# Patient Record
Sex: Female | Born: 1980 | Race: White | Hispanic: No | Smoking: Never smoker
Health system: Southern US, Community
[De-identification: ages and names within clinical notes are randomized; demographics above are authoritative.]

## PROBLEM LIST (undated history)

## (undated) DIAGNOSIS — J45909 Unspecified asthma, uncomplicated: Secondary | ICD-10-CM

---

## 2015-09-21 ENCOUNTER — Emergency Department (HOSPITAL_COMMUNITY)
Admission: EM | Admit: 2015-09-21 | Discharge: 2015-09-21 | Disposition: A | Discharge status: Home / Self Care | Payer: Medicaid Other | Attending: Emergency Medicine | Admitting: Emergency Medicine

## 2015-09-21 ENCOUNTER — Emergency Department (HOSPITAL_COMMUNITY): Payer: Medicaid Other

## 2015-09-21 ENCOUNTER — Encounter (HOSPITAL_COMMUNITY): Payer: Self-pay | Admitting: Emergency Medicine

## 2015-09-21 DIAGNOSIS — W19XXXA Unspecified fall, initial encounter: Secondary | ICD-10-CM

## 2015-09-21 DIAGNOSIS — S4992XA Unspecified injury of left shoulder and upper arm, initial encounter: Secondary | ICD-10-CM | POA: Insufficient documentation

## 2015-09-21 DIAGNOSIS — S8992XA Unspecified injury of left lower leg, initial encounter: Secondary | ICD-10-CM | POA: Diagnosis not present

## 2015-09-21 DIAGNOSIS — S6992XA Unspecified injury of left wrist, hand and finger(s), initial encounter: Secondary | ICD-10-CM | POA: Diagnosis not present

## 2015-09-21 DIAGNOSIS — Y9389 Activity, other specified: Secondary | ICD-10-CM | POA: Insufficient documentation

## 2015-09-21 DIAGNOSIS — Y9289 Other specified places as the place of occurrence of the external cause: Secondary | ICD-10-CM | POA: Diagnosis not present

## 2015-09-21 DIAGNOSIS — W108XXA Fall (on) (from) other stairs and steps, initial encounter: Secondary | ICD-10-CM | POA: Insufficient documentation

## 2015-09-21 DIAGNOSIS — J45909 Unspecified asthma, uncomplicated: Secondary | ICD-10-CM | POA: Diagnosis not present

## 2015-09-21 DIAGNOSIS — S4991XA Unspecified injury of right shoulder and upper arm, initial encounter: Secondary | ICD-10-CM | POA: Insufficient documentation

## 2015-09-21 DIAGNOSIS — S8991XA Unspecified injury of right lower leg, initial encounter: Secondary | ICD-10-CM | POA: Diagnosis present

## 2015-09-21 DIAGNOSIS — Y92009 Unspecified place in unspecified non-institutional (private) residence as the place of occurrence of the external cause: Secondary | ICD-10-CM

## 2015-09-21 DIAGNOSIS — Z79899 Other long term (current) drug therapy: Secondary | ICD-10-CM | POA: Insufficient documentation

## 2015-09-21 DIAGNOSIS — Y998 Other external cause status: Secondary | ICD-10-CM | POA: Diagnosis not present

## 2015-09-21 HISTORY — DX: Unspecified asthma, uncomplicated: J45.909

## 2015-09-21 MED ORDER — OXYCODONE HCL 5 MG PO TABS
5.0000 mg | ORAL_TABLET | Freq: Once | ORAL | Status: AC
  Administered 2015-09-21: 5 mg via ORAL
  Filled 2015-09-21: qty 1

## 2015-09-21 MED ORDER — ACETAMINOPHEN 500 MG PO TABS
1000.0000 mg | ORAL_TABLET | Freq: Once | ORAL | Status: AC
  Administered 2015-09-21: 1000 mg via ORAL
  Filled 2015-09-21: qty 2

## 2015-09-21 MED ORDER — IBUPROFEN 800 MG PO TABS
800.0000 mg | ORAL_TABLET | Freq: Once | ORAL | Status: AC
Start: 2015-09-21 — End: 2015-09-21
  Administered 2015-09-21: 800 mg via ORAL
  Filled 2015-09-21: qty 1

## 2015-09-21 NOTE — ED Provider Notes (Signed)
CSN: [id number]     Arrival date & time 09/21/15  0143 History  By signing my name below, I, [name], attest that this documentation has been prepared under the direction and in the presence of Melene Planan Tametria Aho, DO . Electronically Signed: Freida Busmaniana Omoyeni, Scribe. 09/21/2015. 2:29 AM.    Chief Complaint  Patient presents with  . Fall  . Knee Pain  . Shoulder Pain   The history is provided by the patient. No language interpreter was used.     HPI Comments:  Cynthia Howell is a 35 y.o. female who presents to the Emergency Department s/p fall 4 days ago complaining of constant right knee pain, right shoulder, and left middle finger pain following the incident. Pt states she fell down 6 steps and landed on her right side.  She denies neck pain, CP, and SOB. No alleviating factors noted.    Past Medical History  Diagnosis Date  . Asthma    History reviewed. No pertinent past surgical history. No family history on file. Social History  Substance Use Topics  . Smoking status: Never Smoker   . Smokeless tobacco: None  . Alcohol Use: Yes     Comment: 4 X yearly   OB History    No data available     Review of Systems  Constitutional: Negative for fever and chills.  HENT: Negative for congestion and rhinorrhea.   Eyes: Negative for redness and visual disturbance.  Respiratory: Negative for shortness of breath and wheezing.   Cardiovascular: Negative for chest pain and palpitations.  Gastrointestinal: Negative for nausea and vomiting.  Genitourinary: Negative for dysuria and urgency.  Musculoskeletal: Positive for myalgias and arthralgias.  Skin: Negative for pallor and wound.  Neurological: Negative for dizziness and headaches.  All other systems reviewed and are negative.   Allergies  Morphine and related  Home Medications   Prior to Admission medications   Medication Sig Start Date End Date Taking? Authorizing Provider  albuterol (PROVENTIL HFA;VENTOLIN HFA) 108 (90 Base)  MCG/ACT inhaler Inhale 1 puff into the lungs every 6 (six) hours as needed for wheezing or shortness of breath.   Yes Historical Provider, MD  albuterol (PROVENTIL) (2.5 MG/3ML) 0.083% nebulizer solution Take 2.5 mg by nebulization every 6 (six) hours as needed for wheezing or shortness of breath.   Yes Historical Provider, MD  sertraline (ZOLOFT) 50 MG tablet Take 50 mg by mouth daily.   Yes Historical Provider, MD   BP 110/79 mmHg  Pulse 84  Temp(Src) 98 F (36.7 C) (Oral)  Resp 14  SpO2 93%  LMP 09/21/2015 Physical Exam  Constitutional: She is oriented to person, place, and time. She appears well-developed and well-nourished. No distress.  HENT:  Head: Normocephalic and atraumatic.  Eyes: EOM are normal. Pupils are equal, round, and reactive to light.  Neck: Normal range of motion. Neck supple.  Cardiovascular: Normal rate and regular rhythm.  Exam reveals no gallop and no friction rub.   No murmur heard. Pulmonary/Chest: Effort normal. She has no wheezes. She has no rales.  Abdominal: Soft. She exhibits no distension. There is no tenderness.  Musculoskeletal: She exhibits tenderness. She exhibits no edema.  Tenderness to medial attachment of hamstring of left knee; no intraarticular swelling; meniscal ligaments appear to be intact muscular tenderness noted just medial to humeral head of left shoulder Mild tenderness to right lateral scalpula Pt complains of pain to the left 3rd PIP  Neurological: She is alert and oriented to person, place, and time.  Skin: Skin is warm and dry. She is not diaphoretic.  Psychiatric: She has a normal mood and affect. Her behavior is normal.  Nursing note and vitals reviewed.   ED Course  Procedures   DIAGNOSTIC STUDIES:  Oxygen Saturation is 99% on RA, normal by my interpretation.    COORDINATION OF CARE:  2:20 AM Discussed treatment plan with pt at bedside and pt agreed to plan.  Labs Review Labs Reviewed - No data to display  Imaging  Review Dg Shoulder Right  09/21/2015  CLINICAL DATA:  35 year old female with fall and right shoulder pain. EXAM: RIGHT SHOULDER - 2+ VIEW COMPARISON:  Left shoulder radiograph dated 09/21/2015 FINDINGS: There is no evidence of fracture or dislocation. There is no evidence of arthropathy or other focal bone abnormality. Soft tissues are unremarkable. Partially visualized thoracic spine scoliosis. IMPRESSION: No acute fracture or dislocation of the right shoulder. Electronically Signed   By: Elgie Collard M.D.   On: 09/21/2015 03:36   Dg Shoulder Left  09/21/2015  CLINICAL DATA:  Larey Seat on steps tonight EXAM: LEFT SHOULDER - 2+ VIEW COMPARISON:  None. FINDINGS: There is no evidence of fracture or dislocation. There is no evidence of arthropathy or other focal bone abnormality. Soft tissues are unremarkable. IMPRESSION: Negative. Electronically Signed   By: Ellery Plunk M.D.   On: 09/21/2015 03:35   Dg Knee Complete 4 Views Right  09/21/2015  CLINICAL DATA:  Initial valuation for acute trauma, fall. EXAM: RIGHT KNEE - COMPLETE 4+ VIEW COMPARISON:  None. FINDINGS: There is no evidence of fracture, dislocation, or joint effusion. There is no evidence of arthropathy or other focal bone abnormality. Soft tissues are unremarkable. IMPRESSION: No acute osseous abnormality about the knee. Electronically Signed   By: Rise Mu M.D.   On: 09/21/2015 03:36   Dg Finger Middle Left  09/21/2015  CLINICAL DATA:  Larey Seat on steps tonight EXAM: LEFT MIDDLE FINGER 2+V COMPARISON:  None. FINDINGS: There is PIP soft swelling. No fracture or dislocation. No radiopaque foreign body. IMPRESSION: Negative. Electronically Signed   By: Ellery Plunk M.D.   On: 09/21/2015 03:35   I have personally reviewed and evaluated these images and lab results as part of my medical decision-making.   EKG Interpretation None      MDM   Final diagnoses:  Fall at home, initial encounter    35 yo F With a chief  complaint of a mechanical fall. Patient fell downstairs. Complaining of mostly right-sided pain. Plain films are negative. On exam patient with no significant physical findings. Discharge home. NSAIDs for pain.  I personally performed the services described in this documentation, which was scribed in my presence. The recorded information has been reviewed and is accurate.    I have discussed the diagnosis/risks/treatment options with the patient and family and believe the pt to be eligible for discharge home to follow-up with PCP. We also discussed returning to the ED immediately if new or worsening sx occur. We discussed the sx which are most concerning (e.g., continued pain >1 week) that necessitate immediate return. Medications administered to the patient during their visit and any new prescriptions provided to the patient are listed below.  Medications given during this visit Medications  acetaminophen (TYLENOL) tablet 1,000 mg (1,000 mg Oral Given 09/21/15 0350)  ibuprofen (ADVIL,MOTRIN) tablet 800 mg (800 mg Oral Given 09/21/15 0350)  oxyCODONE (Oxy IR/ROXICODONE) immediate release tablet 5 mg (5 mg Oral Given 09/21/15 0350)    Discharge Medication List as  of 09/21/2015  4:04 AM      The patient appears reasonably screen and/or stabilized for discharge and I doubt any other medical condition or other [facility] requiring further screening, evaluation, or treatment in the ED at this time prior to discharge.     Melene Plan, DO 09/21/15 [phone]

## 2015-09-21 NOTE — Discharge Instructions (Signed)
Take 4 over the counter ibuprofen tablets 3 times a day or 2 over-the-counter naproxen tablets twice a day for pain. ° °

## 2015-09-21 NOTE — ED Notes (Signed)
Pt reports that she fell down the stairs last Friday.  She denies hitting her head or LOC.  Reports that she has taken ibprofen "thinking the pain would go away" but the pain has not decreased.  Pain in her right knee, right shoulder and middle finger on the left hand.

## 2016-10-23 IMAGING — CR DG KNEE COMPLETE 4+V*R*
4 series · 4 of 4 positions shown · non-contrast
Comparison: None.

CLINICAL DATA: Initial valuation for acute trauma, fall.

EXAM:
RIGHT KNEE - COMPLETE 4+ VIEW

[knee ap]
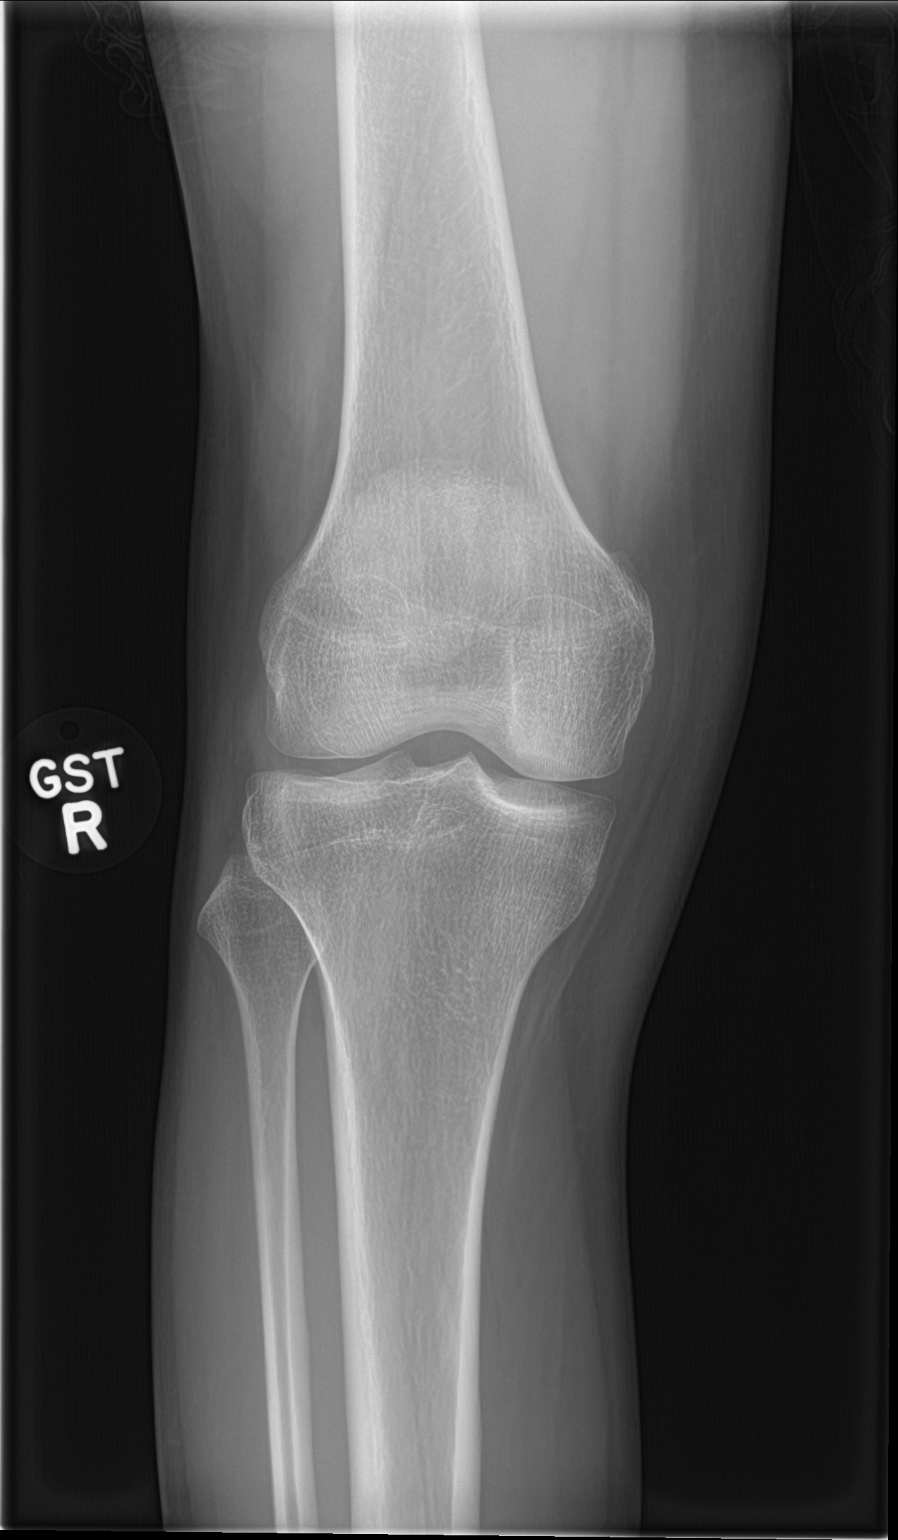

[knee lat]
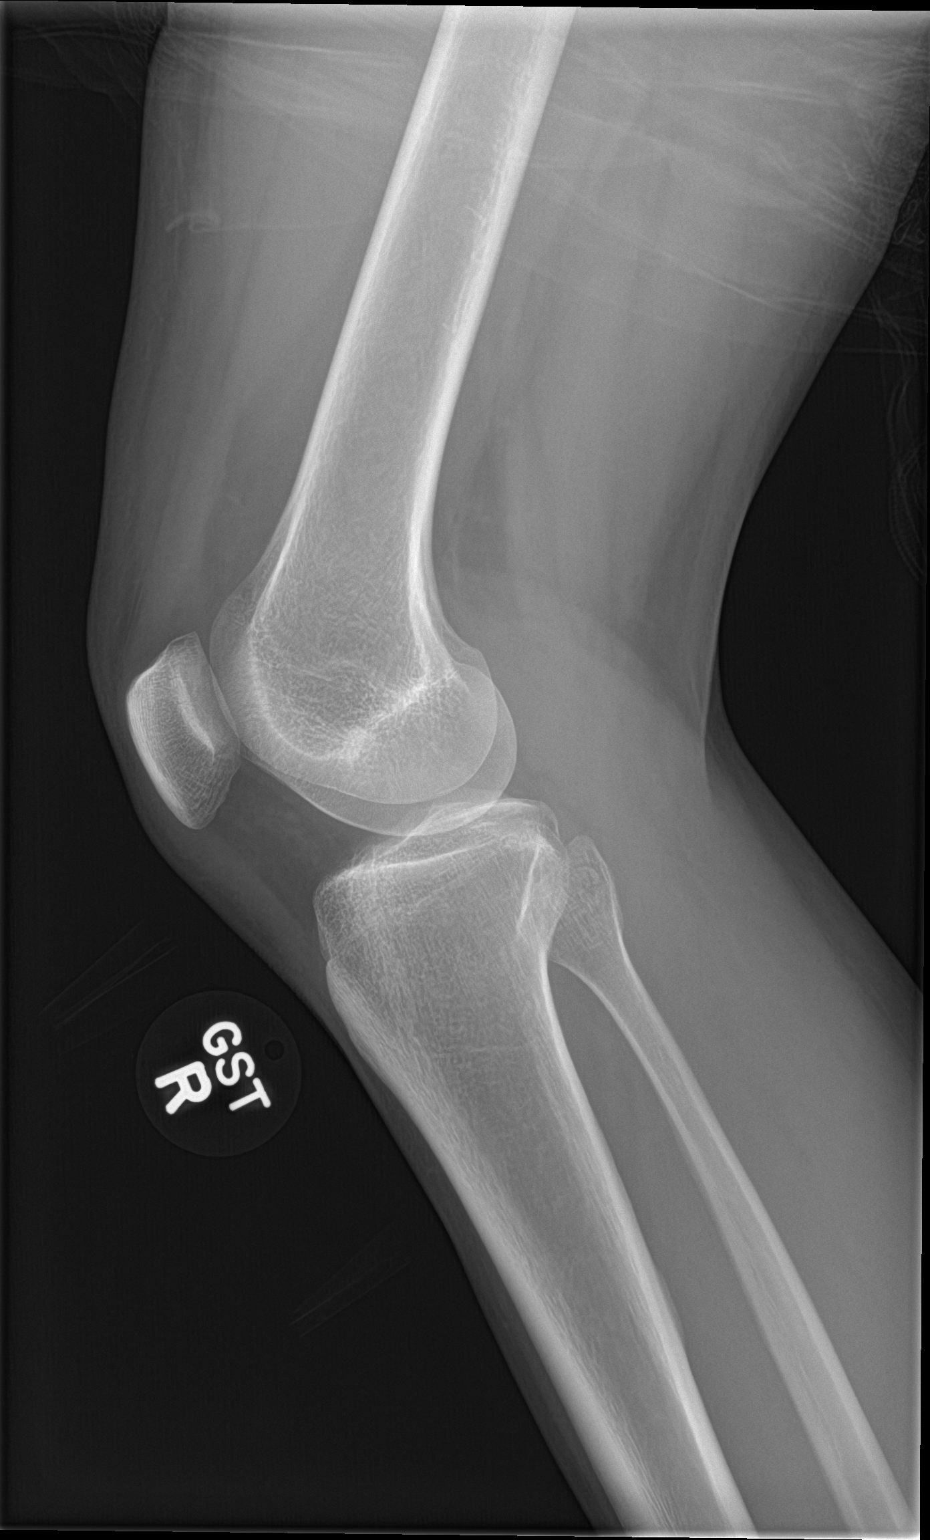

[knee obl (1 of 2)]
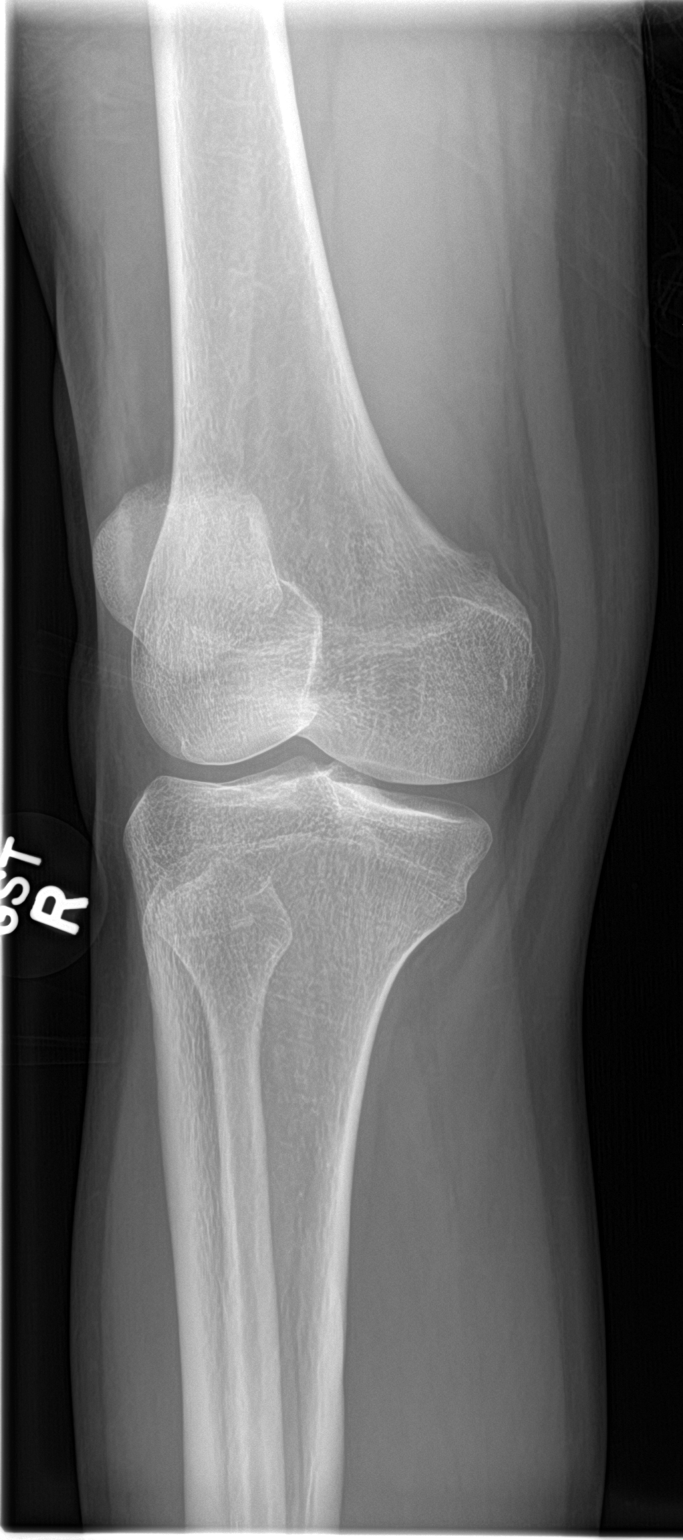

[knee obl (2 of 2)]
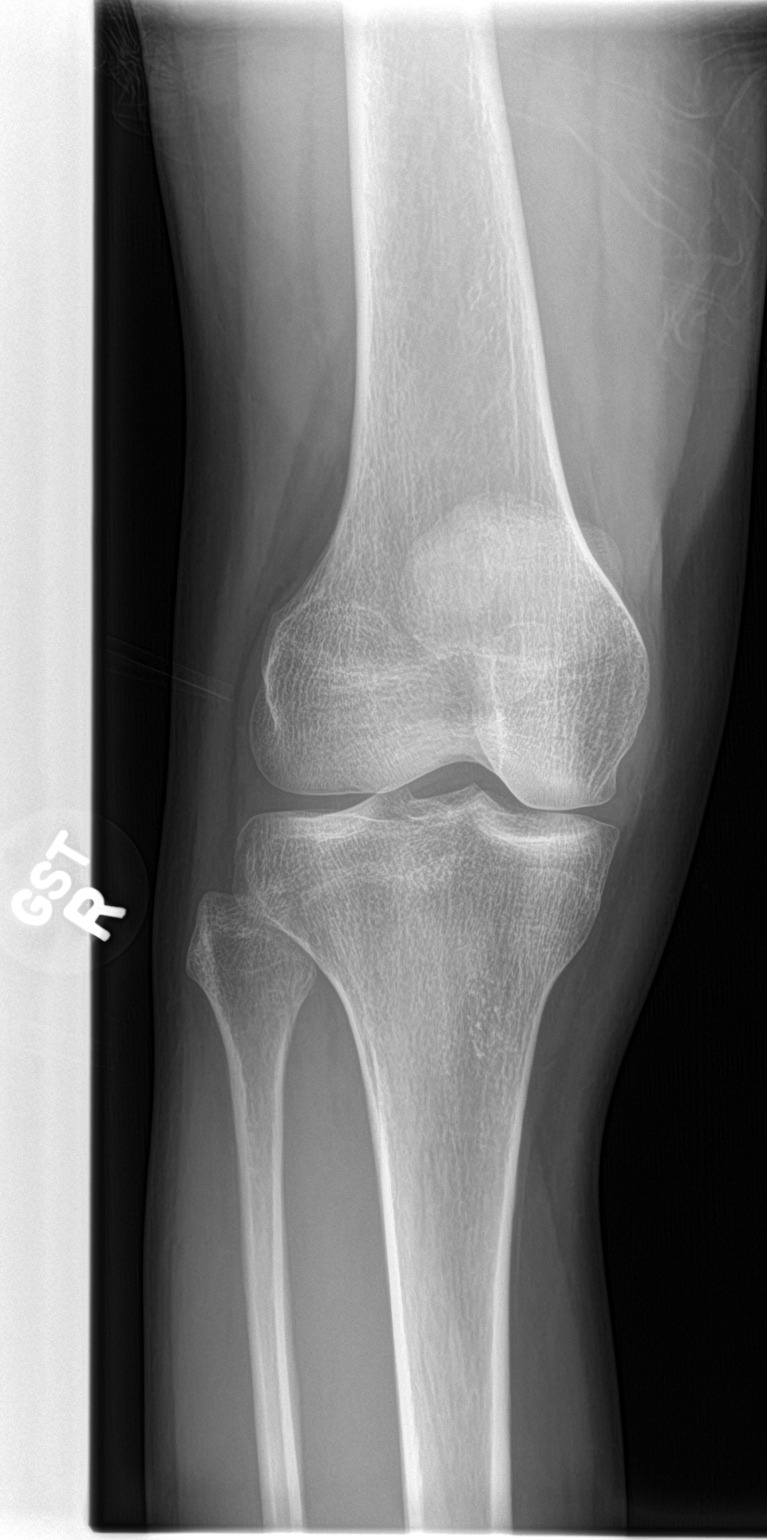

[4 of 4 positions shown; findings below may reference images not displayed]

FINDINGS: There is no evidence of fracture, dislocation, or joint effusion.
There is no evidence of arthropathy or other focal bone abnormality.
Soft tissues are unremarkable.
IMPRESSION: No acute osseous abnormality about the knee.

## 2023-01-19 ENCOUNTER — Encounter (HOSPITAL_BASED_OUTPATIENT_CLINIC_OR_DEPARTMENT_OTHER): Payer: Self-pay | Admitting: Urology

## 2023-01-19 ENCOUNTER — Other Ambulatory Visit: Payer: Self-pay

## 2023-01-19 ENCOUNTER — Emergency Department (HOSPITAL_BASED_OUTPATIENT_CLINIC_OR_DEPARTMENT_OTHER)
Admission: EM | Admit: 2023-01-19 | Discharge: 2023-01-19 | Disposition: A | Discharge status: Home / Self Care | Payer: Medicaid Other | Attending: Emergency Medicine | Admitting: Emergency Medicine

## 2023-01-19 DIAGNOSIS — M5441 Lumbago with sciatica, right side: Secondary | ICD-10-CM | POA: Diagnosis not present

## 2023-01-19 DIAGNOSIS — M545 Low back pain, unspecified: Secondary | ICD-10-CM | POA: Diagnosis present

## 2023-01-19 MED ORDER — LIDOCAINE 5 % EX PTCH: 1.0000 | MEDICATED_PATCH | CUTANEOUS | 0 refills | Status: AC

## 2023-01-19 MED ORDER — METHOCARBAMOL 500 MG PO TABS: 500.0000 mg | ORAL_TABLET | Freq: Two times a day (BID) | ORAL | 0 refills | Status: AC

## 2023-01-19 NOTE — ED Provider Notes (Signed)
[place] [facility] Provider Note   CSN: [id number] Arrival date & time: 01/19/23  1256     History  Chief Complaint  Patient presents with   Hip Pain    Cynthia Howell is a 42 y.o. female with overall noncontributory past medical history who presents with concern for right lower back pain radiating down right leg for 4 days. Denies injury, fall. Denies numbness, tingling. Endorses hx of scoliosis curve of spine. Denies hx of IVDU, chronic steroid use, or previous cancer. Denies loss of sensation of groin, loss of control of urination, defecation.   Hip Pain       Home Medications Prior to Admission medications   Medication Sig Start Date End Date Taking? Authorizing Provider  lidocaine (LIDODERM) 5 % Place 1 patch onto the skin daily. Remove & Discard patch within 12 hours or as directed by MD 01/19/23  Yes Layn Kye H, PA-C  methocarbamol (ROBAXIN) 500 MG tablet Take 1 tablet (500 mg total) by mouth 2 (two) times daily. 01/19/23  Yes Yamilex Borgwardt H, PA-C  albuterol (PROVENTIL HFA;VENTOLIN HFA) 108 (90 Base) MCG/ACT inhaler Inhale 1 puff into the lungs every 6 (six) hours as needed for wheezing or shortness of breath.    [provider]  albuterol (PROVENTIL) (2.5 MG/3ML) 0.083% nebulizer solution Take 2.5 mg by nebulization every 6 (six) hours as needed for wheezing or shortness of breath.    [provider]  sertraline (ZOLOFT) 50 MG tablet Take 50 mg by mouth daily.    [provider]      Allergies    Morphine and codeine    Review of Systems   Review of Systems  All other systems reviewed and are negative.   Physical Exam Updated Vital Signs BP 116/70 (BP Location: Left Arm)   Pulse 90   Temp 98.1 F (36.7 C) (Oral)   Resp 16   Ht 4\' 11"  (1.499 m)   Wt 60.3 kg   LMP 01/09/2023   SpO2 97%   BMI 26.86 kg/m  Physical Exam Vitals and nursing note reviewed.  Constitutional:       General: She is not in acute distress.    Appearance: Normal appearance.  HENT:     Head: Normocephalic and atraumatic.  Eyes:     General:        Right eye: No discharge.        Left eye: No discharge.  Cardiovascular:     Rate and Rhythm: Normal rate and regular rhythm.     Pulses: Normal pulses.     Heart sounds: No murmur heard.    No friction rub. No gallop.  Pulmonary:     Effort: Pulmonary effort is normal.     Breath sounds: Normal breath sounds.  Abdominal:     General: Bowel sounds are normal.     Palpations: Abdomen is soft.  Musculoskeletal:     Comments: Patient with some tenderness in the right lumbar paraspinous muscles, she has positive straight leg raise on right.  But intact strength 5/5 bilateral lower extremities with some effort.  Skin:    General: Skin is warm and dry.     Capillary Refill: Capillary refill takes less than 2 seconds.  Neurological:     Mental Status: She is alert and oriented to person, place, and time.  Psychiatric:        Mood and Affect: Mood normal.        Behavior:  Behavior normal.     ED Results / Procedures / Treatments   Labs (all labs ordered are listed, but only abnormal results are displayed) Labs Reviewed - No data to display  EKG None  Radiology No results found.  Procedures Procedures    Medications Ordered in ED Medications - No data to display  ED Course/ Medical Decision Making/ A&P                                 Medical Decision Making  Patient with back pain.  My emergent differential diagnosis includes slipped disc, compression fracture, spondylolisthesis, less clinical concern for epidural abscess or osteomyelitis based on patient history.  Overall with high clinical suspicion for lumbar sprain, sciatica based on clinical presentation, risk factors.  No neurological deficits. Patient is ambulatory. No warning symptoms of back pain including: fecal incontinence, urinary retention or overflow  incontinence, night sweats, waking from sleep with back pain, unexplained fevers or weight loss, h/o cancer, IVDU, recent trauma. Overall low clinical concern for cauda equina, epidural abscess, or other serious cause of back pain.  Given this work-up, evaluation, physical exam I do not believe that radiographic imaging is indicated at this time.  Conservative measures such as rest, ice/heat, ibuprofen, Tylenol, and  prescription for Robaxin indicated with orthopedic follow-up if no improvement with conservative management.  Extensive return precautions given, patient discharged in stable condition at this time.  Final Clinical Impression(s) / ED Diagnoses Final diagnoses:  Acute right-sided low back pain with right-sided sciatica    Rx / DC Orders ED Discharge Orders          Ordered    methocarbamol (ROBAXIN) 500 MG tablet  2 times daily        01/19/23 1348    lidocaine (LIDODERM) 5 %  Every 24 hours        01/19/23 1348              Doloris Servantes, Kappa H, PA-C 01/19/23 1429    Pricilla Loveless, MD 01/19/23 1432

## 2023-01-19 NOTE — ED Triage Notes (Signed)
Pt states right hip pain that radiates down right leg  Pain worse with ambulation  No noted injury

## 2023-01-19 NOTE — Discharge Instructions (Signed)
